# Patient Record
Sex: Female | Born: 1970 | Race: Black or African American | Hispanic: No | Smoking: Never smoker
Health system: Southern US, Community
[De-identification: ages and names within clinical notes are randomized; demographics above are authoritative.]

## PROBLEM LIST (undated history)

## (undated) DIAGNOSIS — D649 Anemia, unspecified: Secondary | ICD-10-CM

## (undated) DIAGNOSIS — J45909 Unspecified asthma, uncomplicated: Secondary | ICD-10-CM

## (undated) DIAGNOSIS — B009 Herpesviral infection, unspecified: Secondary | ICD-10-CM

## (undated) HISTORY — PX: OTHER SURGICAL HISTORY: SHX169

---

## 2000-02-29 ENCOUNTER — Ambulatory Visit: Admission: RE | Admit: 2000-02-29 | Discharge: 2000-02-29 | Payer: Self-pay | Admitting: Obstetrics and Gynecology

## 2005-07-04 ENCOUNTER — Inpatient Hospital Stay (HOSPITAL_COMMUNITY): Admission: AD | Admit: 2005-07-04 | Discharge: 2005-07-04 | Payer: Self-pay | Admitting: *Deleted

## 2005-12-30 ENCOUNTER — Emergency Department (HOSPITAL_COMMUNITY): Admission: EM | Admit: 2005-12-30 | Discharge: 2005-12-30 | Payer: Self-pay | Admitting: Emergency Medicine

## 2006-04-25 ENCOUNTER — Inpatient Hospital Stay (HOSPITAL_COMMUNITY): Admission: AD | Admit: 2006-04-25 | Discharge: 2006-04-25 | Payer: Self-pay | Admitting: Gynecology

## 2007-05-14 ENCOUNTER — Encounter (HOSPITAL_BASED_OUTPATIENT_CLINIC_OR_DEPARTMENT_OTHER): Payer: Self-pay | Admitting: General Surgery

## 2007-05-14 ENCOUNTER — Ambulatory Visit (HOSPITAL_BASED_OUTPATIENT_CLINIC_OR_DEPARTMENT_OTHER): Admission: RE | Admit: 2007-05-14 | Discharge: 2007-05-14 | Payer: Self-pay | Admitting: General Surgery

## 2009-04-22 ENCOUNTER — Ambulatory Visit (HOSPITAL_COMMUNITY): Admission: RE | Admit: 2009-04-22 | Discharge: 2009-04-22 | Payer: Self-pay | Admitting: Obstetrics

## 2010-03-17 ENCOUNTER — Emergency Department (HOSPITAL_COMMUNITY): Admission: EM | Admit: 2010-03-17 | Discharge: 2010-03-17 | Payer: Self-pay | Admitting: Family Medicine

## 2010-11-20 LAB — POCT I-STAT, CHEM 8
Calcium, Ion: 1 mmol/L — ABNORMAL LOW (ref 1.12–1.32)
Hemoglobin: 12.9 g/dL (ref 12.0–15.0)
Sodium: 138 mEq/L (ref 135–145)

## 2011-01-17 NOTE — Op Note (Signed)
Kathryn Douglas, Kathryn Douglas          ACCOUNT NO.:  [id number]   MEDICAL RECORD NO.:  [id number]          PATIENT TYPE:  AMB   LOCATION:  DSC                          FACILITY:  [facility]   PHYSICIAN:  Leonie Man, M.D.   DATE OF BIRTH:  September 16, 1970   DATE OF PROCEDURE:  05/14/2007  DATE OF DISCHARGE:                               OPERATIVE REPORT   PREOPERATIVE DIAGNOSIS:  Sebaceous cyst of anterior chest wall.   POSTOPERATIVE DIAGNOSIS:  Sebaceous cyst of anterior chest wall.   PROCEDURE:  Excision of sebaceous cyst, anterior chest wall in the  presternal area.   SURGEON:  Leonie Man, M.D.   ASSISTANT:  OR nurse.   ANESTHESIA:  General.   SPECIMENS TO PATHOLOGY:  Sebaceous cyst.   ESTIMATED BLOOD LOSS:  Minimal.   There were no complications during this operation.   The patient is a 40 year old female with a sebaceous cyst just in the  presternal area approximately 3 fingerbreadths above the xiphoid  process.  Complicating the procedure is the fact that she has a very  large tattoo over this area in the inframammary space.  The patient  comes to the operating room after the risks and potential benefits of  surgery have been discussed.  All questions answered and consent  obtained.   PROCEDURE:  The patient was positioned supine.  Then following the  induction of satisfactory general anesthesia, the anterior chest and  inframammary space was prepped and draped to be included in the sterile  operative field.  The region around the cyst was infiltrated with 0.5%  Marcaine with epinephrine.  The vertical incision is made into a region  of the tattoo which we think should be able to be brought back together  without difficulty.  This was taken through the skin and subcutaneous  tissue down to the region of sebaceous cyst.  The cyst was not able to  be identified, however, I dissected down around the entire cyst and  carrying dissection almost down into the sternum.  The cyst  was then  removed in its entirety and forwarded for pathologic evaluation.  Hemostasis secured with  electrocautery.  Subcutaneous tissues closed with 4-0 Vicryl sutures.  Skin closed with 5-0 Monocryl suture and then reinforced with Steri-  Strips.  A sterile dressing is applied.  Anesthetic reversed, the  patient removed from the operating room to the recovery room in stable  condition.  She tolerated the procedure well.      Leonie Man, M.D.  Electronically Signed     PB/MEDQ  D:  05/14/2007  T:  05/14/2007  Job:  [id number]   cc:   Leonie Man, M.D.

## 2011-06-16 LAB — POCT HEMOGLOBIN-HEMACUE: Hemoglobin: 10.6 — ABNORMAL LOW

## 2012-01-01 ENCOUNTER — Other Ambulatory Visit: Payer: Self-pay | Admitting: Obstetrics

## 2012-02-26 ENCOUNTER — Other Ambulatory Visit (HOSPITAL_COMMUNITY): Payer: Self-pay | Admitting: Obstetrics

## 2012-02-26 DIAGNOSIS — Z1231 Encounter for screening mammogram for malignant neoplasm of breast: Secondary | ICD-10-CM

## 2012-03-19 ENCOUNTER — Ambulatory Visit (HOSPITAL_COMMUNITY): Payer: Medicaid Other

## 2012-04-15 ENCOUNTER — Ambulatory Visit (HOSPITAL_COMMUNITY)
Admission: RE | Admit: 2012-04-15 | Discharge: 2012-04-15 | Disposition: A | Discharge status: Home / Self Care | Payer: Medicaid Other | Source: Ambulatory Visit | Attending: Obstetrics | Admitting: Obstetrics

## 2012-04-15 DIAGNOSIS — Z1231 Encounter for screening mammogram for malignant neoplasm of breast: Secondary | ICD-10-CM | POA: Insufficient documentation

## 2012-05-21 ENCOUNTER — Other Ambulatory Visit (HOSPITAL_COMMUNITY): Payer: Self-pay | Admitting: Obstetrics

## 2012-07-26 ENCOUNTER — Encounter (HOSPITAL_COMMUNITY): Payer: Self-pay | Admitting: Emergency Medicine

## 2012-07-26 ENCOUNTER — Emergency Department (HOSPITAL_COMMUNITY)
Admission: EM | Admit: 2012-07-26 | Discharge: 2012-07-26 | Disposition: A | Discharge status: Home / Self Care | Payer: Medicaid Other | Attending: Emergency Medicine | Admitting: Emergency Medicine

## 2012-07-26 DIAGNOSIS — Z79899 Other long term (current) drug therapy: Secondary | ICD-10-CM | POA: Insufficient documentation

## 2012-07-26 DIAGNOSIS — M25539 Pain in unspecified wrist: Secondary | ICD-10-CM | POA: Insufficient documentation

## 2012-07-26 DIAGNOSIS — Z862 Personal history of diseases of the blood and blood-forming organs and certain disorders involving the immune mechanism: Secondary | ICD-10-CM | POA: Insufficient documentation

## 2012-07-26 DIAGNOSIS — G40401 Other generalized epilepsy and epileptic syndromes, not intractable, with status epilepticus: Secondary | ICD-10-CM | POA: Insufficient documentation

## 2012-07-26 DIAGNOSIS — R209 Unspecified disturbances of skin sensation: Secondary | ICD-10-CM | POA: Insufficient documentation

## 2012-07-26 DIAGNOSIS — J45909 Unspecified asthma, uncomplicated: Secondary | ICD-10-CM | POA: Insufficient documentation

## 2012-07-26 DIAGNOSIS — G5630 Lesion of radial nerve, unspecified upper limb: Secondary | ICD-10-CM

## 2012-07-26 HISTORY — DX: Anemia, unspecified: D64.9

## 2012-07-26 HISTORY — DX: Unspecified asthma, uncomplicated: J45.909

## 2012-07-26 MED ORDER — NAPROXEN 500 MG PO TABS: 500.0000 mg | ORAL_TABLET | Freq: Two times a day (BID) | ORAL | Status: DC

## 2012-07-26 NOTE — ED Notes (Signed)
Pt presents to the ED with a numbness to the left hand.  Pt was arrested on Sunday last.  Pt states that the handcuffs were put on too tight and has caused injury.

## 2012-07-26 NOTE — ED Notes (Signed)
Rx given x1 D/c instructions reviewed w/ pt - pt denies any further questions or concerns at present.  Pt ambulating independently w/ steady gait on d/c in no acute distress, A&Ox4.

## 2012-07-26 NOTE — ED Provider Notes (Signed)
History     CSN: [id number]  Arrival date & time 07/26/12  1916   First MD Initiated Contact with Patient 07/26/12 2112      Chief Complaint  Patient presents with  . Extremity Pain    (Consider location/radiation/quality/duration/timing/severity/associated sxs/prior treatment) HPI Kathryn Douglas is a 41 y.o. female who presented to ED with complaint of left wrist pain and numbness in left thumb. Pt states was arrested and placed in handcuffs 5 days ago. States since then pain in left wrist not improving and states no sensation to left thumb. States still able to move the thumb but it feels weak. 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 Taking ibuprofen with no relief. No other complaints. Normal sensation to the rest of the fingers.   Past Medical History  Diagnosis Date  . Asthma   . Anemia     Past Surgical History  Procedure Date  . Tubular ligation     History reviewed. No pertinent family history.  History  Substance Use Topics  . Smoking status: Never Smoker   . Smokeless tobacco: Not on file  . Alcohol Use: No    OB History    Grav Para Term Preterm Abortions TAB SAB Ect Mult Living                  Review of Systems  Constitutional: Negative for chills and fatigue.  Respiratory: Negative.   Cardiovascular: Negative.   Musculoskeletal: Positive for joint swelling.  Skin: Negative.   Neurological: Positive for weakness and numbness.    Allergies  Review of patient's allergies indicates no known allergies.  Home Medications   Current Outpatient Rx  Name  Route  Sig  Dispense  Refill  . ALBUTEROL SULFATE HFA 108 (90 BASE) MCG/ACT IN AERS   Inhalation   Inhale 2 puffs into the lungs every 6 (six) hours as needed.         . IBUPROFEN 800 MG PO TABS   Oral   Take 800 mg by mouth every 8 (eight) hours as needed.         Marland Kitchen VALACYCLOVIR HCL 1 G PO TABS   Oral   Take 1,000 mg by mouth daily.           BP 121/91  Pulse 62  Temp 98.4 F (36.9 C) (Oral)  Resp 16  SpO2 98%  LMP 07/04/2012  Physical Exam  Nursing note and vitals reviewed. Constitutional: She appears well-developed and well-nourished. No distress.  Cardiovascular: Normal rate, regular rhythm and normal heart sounds.   Pulmonary/Chest: Effort normal and breath sounds normal. No respiratory distress. She has no wheezes. She has no rales.  Musculoskeletal:       Normal appearing bilateral wrist. Full rom of the left wrist. Full ROM of the thumb and all fingers. Pt is able to spread all fingers wide, able to touch each finger to her thumb, able to make OK sign, able to lift thumb up from a hand flat laying on the table position. Pt does have decreased sensation over dorsal entire thumb and 1st metacarpal. Failed two point discrimination. Good strength against resistance in all directions of the left thumb. Good car refill <2 sec in all finger tips  Neurological: She is alert.  Skin: Skin is warm and dry.    ED Course  Procedures (including critical care time)    1. Wrist pain   2. Radial nerve palsy  MDM  Pt has decreased sensation in the distribution of the radial nerve, however, strength and motor function intact. Suspect possible temporary nerve palsy from tight hand cuffs at the wrist level. 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 Will refer to hand surgery. No imaging necessary at this time. Pt has no pain. Will start on NSAIDs for inflammation.         Lottie Mussel, PA 07/27/12 1642

## 2012-07-26 NOTE — ED Notes (Signed)
Pt admits to being arrested on Sunday and placed in handcuffs - pt now experiencing left hand/wrist pain/numbness. CMS intact, no obvious deformities noted.

## 2012-08-07 NOTE — ED Provider Notes (Signed)
Medical screening examination/treatment/procedure(s) were performed by non-physician practitioner and as supervising physician I was immediately available for consultation/collaboration. Devoria Albe, MD, Armando Gang   Ward Givens, MD 08/07/12 [phone]

## 2012-10-08 ENCOUNTER — Emergency Department (HOSPITAL_COMMUNITY)
Admission: EM | Admit: 2012-10-08 | Discharge: 2012-10-08 | Disposition: A | Discharge status: Home / Self Care | Payer: Medicaid Other | Source: Home / Self Care | Attending: Emergency Medicine | Admitting: Emergency Medicine

## 2012-10-08 ENCOUNTER — Encounter (HOSPITAL_COMMUNITY): Payer: Self-pay | Admitting: *Deleted

## 2012-10-08 DIAGNOSIS — R21 Rash and other nonspecific skin eruption: Secondary | ICD-10-CM

## 2012-10-08 HISTORY — DX: Herpesviral infection, unspecified: B00.9

## 2012-10-08 MED ORDER — PERMETHRIN 5 % EX CREA: TOPICAL_CREAM | CUTANEOUS | Status: DC

## 2012-10-08 MED ORDER — HYDROXYZINE HCL 25 MG PO TABS: 25.0000 mg | ORAL_TABLET | Freq: Four times a day (QID) | ORAL | Status: DC

## 2012-10-08 MED ORDER — PREDNISONE 20 MG PO TABS: 40.0000 mg | ORAL_TABLET | Freq: Every day | ORAL | Status: AC

## 2012-10-08 NOTE — ED Notes (Signed)
C/o rash onset 2-3 weeks.  Started itching 2 weeks on R lat. hip.  Feels like ants pinching her. Rash ,red and raised on her  abdomen, back between her thighs, buttocks and forearms.

## 2012-10-08 NOTE — ED Provider Notes (Signed)
History     CSN: [id number]  Arrival date & time 10/08/12  1713   First MD Initiated Contact with Patient 10/08/12 1753      Chief Complaint  Patient presents with  . Rash    (Consider location/radiation/quality/duration/timing/severity/associated sxs/prior treatment) Patient is a 42 y.o. female presenting with rash. The history is provided by the patient.  Rash  This is a new problem. The problem has not changed since onset.The problem is associated with nothing. There has been no fever. The rash is present on the trunk, torso, left upper leg and left arm. The pain is at a severity of 1/10. The patient is experiencing no pain. The pain has been intermittent since onset. She has tried antihistamines for the symptoms. The treatment provided no relief.    Past Medical History  Diagnosis Date  . Asthma   . Anemia   . Herpes simplex type 2 infection     Past Surgical History  Procedure Date  . Tubular ligation     Family History  Problem Relation Age of Onset  . Diabetes Mother     History  Substance Use Topics  . Smoking status: Never Smoker   . Smokeless tobacco: Not on file  . Alcohol Use: 1.2 oz/week    2 Cans of beer per week     Comment: 0ccasional    OB History    Grav Para Term Preterm Abortions TAB SAB Ect Mult Living                  Review of Systems  Constitutional: Negative for chills, diaphoresis, activity change, appetite change and fatigue.  HENT: Negative for neck pain and neck stiffness.   Skin: Positive for rash. Negative for color change.  Neurological: Negative for dizziness.    Allergies  Review of patient's allergies indicates no known allergies.  Home Medications   Current Outpatient Rx  Name  Route  Sig  Dispense  Refill  . ALBUTEROL SULFATE HFA 108 (90 BASE) MCG/ACT IN AERS   Inhalation   Inhale 2 puffs into the lungs every 6 (six) hours as needed.         Marland Kitchen VALACYCLOVIR HCL 1 G PO TABS   Oral   Take 1,000 mg by mouth  daily.         Marland Kitchen HYDROXYZINE HCL 25 MG PO TABS   Oral   Take 1 tablet (25 mg total) by mouth every 6 (six) hours.   12 tablet   0   . PERMETHRIN 5 % EX CREA      Apply to affected area once leave on for 10 hours and repeat in 7 days   60 g   0   . PREDNISONE 20 MG PO TABS   Oral   Take 2 tablets (40 mg total) by mouth daily. 2 tablets daily for 5 days   10 tablet   0     BP 144/100  Pulse 75  Temp 98.6 F (37 C) (Oral)  Resp 17  SpO2 100%  LMP 09/28/2012  Physical Exam  Constitutional: Vital signs are normal. She appears well-developed and well-nourished.  Non-toxic appearance. She does not have a sickly appearance. She does not appear ill. No distress.  HENT:  Head: Normocephalic.  Neurological: She is alert.  Skin: Rash noted. Rash is papular. There is erythema.       ED Course  Procedures (including critical care time)  Labs Reviewed - No data to display No  results found.   1. Papular eruption       MDM  Global disseminated papular eruption with moderate to severe pruritus- will treat patient empirically for scabies also patient was provided with prednisone and hydroxyzine. Patient will monitor family members at home as if any similar rash they will need to be treated.        Jimmie Molly, MD 10/08/12 [phone]

## 2012-10-18 ENCOUNTER — Encounter (HOSPITAL_COMMUNITY): Payer: Self-pay | Admitting: *Deleted

## 2012-10-18 ENCOUNTER — Telehealth (HOSPITAL_COMMUNITY): Payer: Self-pay | Admitting: *Deleted

## 2012-10-18 ENCOUNTER — Emergency Department (INDEPENDENT_AMBULATORY_CARE_PROVIDER_SITE_OTHER)
Admission: EM | Admit: 2012-10-18 | Discharge: 2012-10-18 | Disposition: A | Discharge status: Home / Self Care | Payer: Medicaid Other | Source: Home / Self Care | Attending: Family Medicine | Admitting: Family Medicine

## 2012-10-18 DIAGNOSIS — L309 Dermatitis, unspecified: Secondary | ICD-10-CM

## 2012-10-18 DIAGNOSIS — L259 Unspecified contact dermatitis, unspecified cause: Secondary | ICD-10-CM

## 2012-10-18 MED ORDER — TRIAMCINOLONE 0.1 % CREAM:EUCERIN CREAM 1:1: TOPICAL_CREAM | CUTANEOUS | Status: AC

## 2012-10-18 MED ORDER — TRIAMCINOLONE ACETONIDE 0.5 % EX OINT: TOPICAL_OINTMENT | Freq: Two times a day (BID) | CUTANEOUS | Status: AC

## 2012-10-18 MED ORDER — HYDROXYZINE HCL 50 MG PO TABS: 50.0000 mg | ORAL_TABLET | Freq: Three times a day (TID) | ORAL | Status: AC | PRN

## 2012-10-18 MED ORDER — PRAMOXINE HCL 1 % EX LOTN: 1.0000 "application " | TOPICAL_LOTION | Freq: Two times a day (BID) | CUTANEOUS | Status: AC | PRN

## 2012-10-18 NOTE — ED Notes (Addendum)
C/o fine red raised rash continuing and spread to L upper arm onset last night with itching  Has one on buttocks and inside L upper inner thigh recently.

## 2012-10-18 NOTE — ED Notes (Signed)
Pt. called on VM and said she was seen here 10 days ago and was misdiagnosed.  I called pt. back.  She said she came in tonight and was seen again and got medication.  No concerns now.

## 2012-10-22 NOTE — ED Provider Notes (Signed)
History     CSN: [id number]  Arrival date & time 10/18/12  7829   First MD Initiated Contact with Patient 10/18/12 1925      Chief Complaint  Patient presents with  . Rash    (Consider location/radiation/quality/duration/timing/severity/associated sxs/prior treatment) HPI Comments: 42 y/o female here c/o dry skin patches and generalized skin itchiness for several weeks. She was treated two weeks ago for possible scabies and states her symptoms improved while she took prednisone and hydroxyzine. 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 Son has eczema. No known h/o psoriasis, hepatitis or sarcoidosis.   Past Medical History  Diagnosis Date  . Asthma   . Anemia   . Herpes simplex type 2 infection     Past Surgical History  Procedure Laterality Date  . Tubular ligation      Family History  Problem Relation Age of Onset  . Diabetes Mother     History  Substance Use Topics  . Smoking status: Never Smoker   . Smokeless tobacco: Not on file  . Alcohol Use: 1.2 oz/week    2 Cans of beer per week     Comment: 0ccasional    OB History   Grav Para Term Preterm Abortions TAB SAB Ect Mult Living                  Review of Systems  Constitutional: Negative for fever, chills, activity change, appetite change and fatigue.  Respiratory: Negative for cough and shortness of breath.   Gastrointestinal: Negative for nausea, vomiting, abdominal pain and diarrhea.  Endocrine: Negative for cold intolerance, heat intolerance, polydipsia, polyphagia and polyuria.  Musculoskeletal: Negative for myalgias, joint swelling and arthralgias.  Skin: Positive for rash.  Allergic/Immunologic: Positive for environmental allergies. Negative for immunocompromised state.  Neurological: Negative for headaches.    Allergies  Review of patient's allergies indicates no known allergies.  Home Medications    Current Outpatient Rx  Name  Route  Sig  Dispense  Refill  . albuterol (PROVENTIL HFA;VENTOLIN HFA) 108 (90 BASE) MCG/ACT inhaler   Inhalation   Inhale 2 puffs into the lungs every 6 (six) hours as needed.         . valACYclovir (VALTREX) 1000 MG tablet   Oral   Take 1,000 mg by mouth daily.         . hydrOXYzine (ATARAX/VISTARIL) 50 MG tablet   Oral   Take 1 tablet (50 mg total) by mouth every 8 (eight) hours as needed for itching.   30 tablet   0   . pramoxine (SARNA SENSITIVE) 1 % LOTN   Topical   Apply 1 application topically 2 (two) times daily as needed.   1 Bottle   0   . Triamcinolone Acetonide (TRIAMCINOLONE 0.1 % CREAM : EUCERIN) CREA      Triamcinolone 0.1% compounded 1:1 with Eucerin cream. To use twice a day when necessary for eczema maintenance. Dispense 450 g   1 each   0   . triamcinolone ointment (KENALOG) 0.5 %   Topical   Apply topically 2 (two) times daily.   30 g   0     BP 122/85  Pulse 73  Temp(Src) 98.7 F (37.1 C) (Oral)  Resp 18  SpO2 100%  LMP 09/28/2012  Physical Exam  Nursing note and vitals reviewed. Constitutional: She is oriented to person, place, and time. She appears well-developed and  well-nourished. No distress.  HENT:  Head: Normocephalic and atraumatic.  Eyes: Conjunctivae are normal. No scleral icterus.  Neck: No thyromegaly present.  Cardiovascular: Normal heart sounds.   Pulmonary/Chest: Breath sounds normal.  Abdominal: Soft. She exhibits no mass. There is no tenderness.  Lymphadenopathy:    She has no cervical adenopathy.  Neurological: She is alert and oriented to person, place, and time.  Skin: She is not diaphoretic. generalized dry skin with dry patches. Also hyperpigmented flat scar lesions from prior rashes.There are acne scars in face and back. hypertrophic hair follicles in lateral proximal arms.    ED Course  Procedures (including critical care time)  Labs Reviewed - No data to display No  results found.   1. Dermatitis, eczematoid       MDM  Impress hyperpigmented lesion are from prior rashes and acne. Also impress patient has eczema. Prescribed triamcinolone ointment and compounded with Eucerin for maintenance. Atarax and pramoxine lotion. Dermatology referral as needed.        Sharin Grave, MD 10/23/12 [phone]

## 2014-01-22 ENCOUNTER — Other Ambulatory Visit: Payer: Self-pay

## 2014-01-22 DIAGNOSIS — Z1231 Encounter for screening mammogram for malignant neoplasm of breast: Secondary | ICD-10-CM

## 2014-01-22 LAB — PROCEDURE REPORT - SCANNED: PAP SMEAR: NEGATIVE

## 2014-01-23 ENCOUNTER — Encounter (INDEPENDENT_AMBULATORY_CARE_PROVIDER_SITE_OTHER): Payer: Self-pay

## 2014-01-23 ENCOUNTER — Ambulatory Visit
Admission: RE | Admit: 2014-01-23 | Discharge: 2014-01-23 | Disposition: A | Discharge status: Home / Self Care | Payer: Medicaid Other | Source: Ambulatory Visit

## 2014-01-23 DIAGNOSIS — Z1231 Encounter for screening mammogram for malignant neoplasm of breast: Secondary | ICD-10-CM

## 2014-10-19 ENCOUNTER — Emergency Department (HOSPITAL_COMMUNITY)
Admission: EM | Admit: 2014-10-19 | Discharge: 2014-10-19 | Disposition: A | Discharge status: Home / Self Care | Payer: Medicaid Other | Attending: Emergency Medicine | Admitting: Emergency Medicine

## 2014-10-19 ENCOUNTER — Emergency Department (HOSPITAL_COMMUNITY): Payer: Medicaid Other

## 2014-10-19 DIAGNOSIS — Z8619 Personal history of other infectious and parasitic diseases: Secondary | ICD-10-CM | POA: Diagnosis not present

## 2014-10-19 DIAGNOSIS — Z7952 Long term (current) use of systemic steroids: Secondary | ICD-10-CM | POA: Insufficient documentation

## 2014-10-19 DIAGNOSIS — S199XXA Unspecified injury of neck, initial encounter: Secondary | ICD-10-CM | POA: Diagnosis not present

## 2014-10-19 DIAGNOSIS — J45909 Unspecified asthma, uncomplicated: Secondary | ICD-10-CM | POA: Diagnosis not present

## 2014-10-19 DIAGNOSIS — Z79899 Other long term (current) drug therapy: Secondary | ICD-10-CM | POA: Diagnosis not present

## 2014-10-19 DIAGNOSIS — M79641 Pain in right hand: Secondary | ICD-10-CM

## 2014-10-19 DIAGNOSIS — Y9389 Activity, other specified: Secondary | ICD-10-CM | POA: Diagnosis not present

## 2014-10-19 DIAGNOSIS — Y92410 Unspecified street and highway as the place of occurrence of the external cause: Secondary | ICD-10-CM | POA: Diagnosis not present

## 2014-10-19 DIAGNOSIS — Y998 Other external cause status: Secondary | ICD-10-CM | POA: Diagnosis not present

## 2014-10-19 DIAGNOSIS — S3992XA Unspecified injury of lower back, initial encounter: Secondary | ICD-10-CM | POA: Diagnosis not present

## 2014-10-19 DIAGNOSIS — S6991XA Unspecified injury of right wrist, hand and finger(s), initial encounter: Secondary | ICD-10-CM | POA: Insufficient documentation

## 2014-10-19 DIAGNOSIS — Z862 Personal history of diseases of the blood and blood-forming organs and certain disorders involving the immune mechanism: Secondary | ICD-10-CM | POA: Insufficient documentation

## 2014-10-19 MED ORDER — HYDROCODONE-ACETAMINOPHEN 5-325 MG PO TABS: 1.0000 | ORAL_TABLET | Freq: Four times a day (QID) | ORAL | Status: AC | PRN
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
## 2016-05-27 ENCOUNTER — Encounter: Payer: Self-pay | Admitting: *Deleted

## 2018-08-14 ENCOUNTER — Ambulatory Visit: Payer: Self-pay | Admitting: Certified Nurse Midwife
